# Patient Record
Sex: Male | Born: 1990 | Hispanic: No | Marital: Single | Smoking: Current every day smoker
Health system: Southern US, Community
[De-identification: ages and names within clinical notes are randomized; demographics above are authoritative.]

---
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
## 2012-12-28 ENCOUNTER — Encounter (HOSPITAL_COMMUNITY): Payer: Self-pay | Admitting: *Deleted

## 2012-12-28 ENCOUNTER — Emergency Department (INDEPENDENT_AMBULATORY_CARE_PROVIDER_SITE_OTHER)
Admission: EM | Admit: 2012-12-28 | Discharge: 2012-12-28 | Disposition: A | Discharge status: Home / Self Care | Payer: Self-pay | Source: Home / Self Care

## 2012-12-28 DIAGNOSIS — K0889 Other specified disorders of teeth and supporting structures: Secondary | ICD-10-CM

## 2012-12-28 DIAGNOSIS — K089 Disorder of teeth and supporting structures, unspecified: Secondary | ICD-10-CM

## 2012-12-28 MED ORDER — AMOXICILLIN 500 MG PO CAPS: 500.0000 mg | ORAL_CAPSULE | Freq: Three times a day (TID) | ORAL | Status: DC

## 2012-12-28 MED ORDER — HYDROCODONE-ACETAMINOPHEN 5-325 MG PO TABS: 2.0000 | ORAL_TABLET | ORAL | Status: DC | PRN

## 2012-12-28 NOTE — ED Provider Notes (Signed)
  CSN: [id number]     Arrival date & time 12/28/12  1227 History     First MD Initiated Contact with Patient 12/28/12 1353     Chief Complaint  Patient presents with  . Dental Pain   (Consider location/radiation/quality/duration/timing/severity/associated sxs/prior Treatment) Patient is a 22 y.o. male presenting with tooth pain. The history is provided by the patient. No language interpreter was used.  Dental Pain Location:  Lower Lower teeth location:  22/LL cuspid Quality:  Aching Severity:  Moderate Onset quality:  Gradual Timing:  Constant Progression:  Worsening Pt complains of a toothache  History reviewed. No pertinent past medical history. History reviewed. No pertinent past surgical history. History reviewed. No pertinent family history. History  Substance Use Topics  . Smoking status: Current Every Day Smoker  . Smokeless tobacco: Not on file  . Alcohol Use: Yes    Review of Systems  HENT: Positive for dental problem.   All other systems reviewed and are negative.    Allergies  Review of patient's allergies indicates no known allergies.  Home Medications  No current outpatient prescriptions on file. BP 135/71  Pulse 70  Temp(Src) 99.4 F (37.4 C) (Oral)  Resp 18  SpO2 100% Physical Exam  Nursing note and vitals reviewed. Constitutional: He is oriented to person, place, and time. He appears well-developed and well-nourished.  HENT:  Head: Normocephalic.  Swelling left face   Cardiovascular: Normal rate.   Pulmonary/Chest: Effort normal.  Musculoskeletal: Normal range of motion.  Neurological: He is alert and oriented to person, place, and time. He has normal reflexes.  Skin: Skin is warm.  Psychiatric: He has a normal mood and affect.    ED Course   Procedures (including critical care time)  Labs Reviewed - No data to display No results found. 1. Toothache     MDM  amoxicillian and hydrocodone,    Elson Areas, PA-C 12/28/12  1415  Medical screening examination/treatment/procedure(s) were performed by a resident physician or non-physician practitioner and as the supervising physician I was immediately available for consultation/collaboration.  Clementeen Graham, MD   Rodolph Bong, MD 12/28/12 Rosamaria Lints

## 2012-12-28 NOTE — ED Notes (Signed)
Pt  Has  Pain  /  Swelling  To  The  Left  Side  Of  His   Face      For  Several  Days         He reports  He       Has  Had  A  Broken tooth  In  Past      He  Is  Sitting  Upright on  Exam table  Speaking in  Complete  sentances  And  Is  In no  Severe  Distress

## 2014-01-31 ENCOUNTER — Encounter (HOSPITAL_COMMUNITY): Payer: Self-pay | Admitting: Emergency Medicine

## 2014-01-31 ENCOUNTER — Emergency Department (HOSPITAL_COMMUNITY)
Admission: EM | Admit: 2014-01-31 | Discharge: 2014-01-31 | Disposition: A | Discharge status: Home / Self Care | Payer: Self-pay | Attending: Emergency Medicine | Admitting: Emergency Medicine

## 2014-01-31 DIAGNOSIS — H9209 Otalgia, unspecified ear: Secondary | ICD-10-CM | POA: Insufficient documentation

## 2014-01-31 DIAGNOSIS — F17200 Nicotine dependence, unspecified, uncomplicated: Secondary | ICD-10-CM | POA: Insufficient documentation

## 2014-01-31 DIAGNOSIS — K089 Disorder of teeth and supporting structures, unspecified: Secondary | ICD-10-CM | POA: Insufficient documentation

## 2014-01-31 DIAGNOSIS — K0889 Other specified disorders of teeth and supporting structures: Secondary | ICD-10-CM

## 2014-01-31 DIAGNOSIS — Z792 Long term (current) use of antibiotics: Secondary | ICD-10-CM | POA: Insufficient documentation

## 2014-01-31 DIAGNOSIS — R51 Headache: Secondary | ICD-10-CM | POA: Insufficient documentation

## 2014-01-31 MED ORDER — HYDROCODONE-ACETAMINOPHEN 5-325 MG PO TABS: 1.0000 | ORAL_TABLET | Freq: Four times a day (QID) | ORAL | Status: DC | PRN

## 2014-01-31 MED ORDER — PENICILLIN V POTASSIUM 500 MG PO TABS: 500.0000 mg | ORAL_TABLET | Freq: Four times a day (QID) | ORAL | Status: AC
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
## 2014-01-31 NOTE — ED Notes (Signed)
Pt in c/o toothache for awhile, states he has an abscess to area, denies fever

## 2014-01-31 NOTE — Discharge Instructions (Signed)

## 2014-01-31 NOTE — ED Provider Notes (Signed)
CSN: [id number]     Arrival date & time 01/31/14  1914 History  This chart was scribed for non-physician practitioner, Roxy Horseman, PA-C working with Flint Melter, MD, by Jarvis Morgan, ED Scribe. This patient was seen in room TR06C/TR06C and the patient's care was started at 7:47 PM.   Chief Complaint  Patient presents with  . Dental Pain      The history is provided by the patient. No language interpreter was used.   HPI Comments: Victor Weiss is a 23 y.o. male with no pertinent medical history who presents to the Emergency Department with a chief complaint of dental pain. He states he had an abscess to his tooth on the left side of his mouth. He notes that the abscess popped while he was in the waiting room. He was given gauze by the triage nurse to control the bleeding. He states he is having associated left otalgia and HA. Pt has not taken anything for the pain. He denies any fever, nausea, or vomiting,   History reviewed. No pertinent past medical history. History reviewed. No pertinent past surgical history. History reviewed. No pertinent family history. History  Substance Use Topics  . Smoking status: Current Every Day Smoker  . Smokeless tobacco: Not on file  . Alcohol Use: Yes    Review of Systems  Constitutional: Negative for fever.  HENT: Positive for dental problem and ear pain.   Gastrointestinal: Negative for nausea and vomiting.  Neurological: Positive for headaches.      Allergies  Review of patient's allergies indicates no known allergies.  Home Medications   Prior to Admission medications   Medication Sig Start Date End Date Taking? Authorizing Provider  amoxicillin (AMOXIL) 500 MG capsule Take 1 capsule (500 mg total) by mouth 3 (three) times daily. 12/28/12   Elson Areas, PA-C  HYDROcodone-acetaminophen (NORCO/VICODIN) 5-325 MG per tablet Take 2 tablets by mouth every 4 (four) hours as needed for pain. 12/28/12   Elson Areas, PA-C    Triage Vitals: BP 120/54  Pulse 91  Temp(Src) 98.2 F (36.8 C)  Resp 20  Wt 131 lb 9 oz (59.676 kg)  SpO2 100%  Physical Exam  Nursing note and vitals reviewed. Constitutional: He is oriented to person, place, and time. He appears well-developed and well-nourished. No distress.  HENT:  Head: Normocephalic and atraumatic.  Mouth/Throat:    Poor dentition throughout.  Affected tooth as diagrammed.  No signs of peritonsillar or tonsillar abscess.  No signs of gingival abscess. Oropharynx is clear and without exudates.  Uvula is midline.  Airway is intact. No signs of Ludwig's angina with palpation of oral and sublingual mucosa.   Eyes: Conjunctivae and EOM are normal.  Neck: Neck supple. No tracheal deviation present.  Cardiovascular: Normal rate.   Pulmonary/Chest: Effort normal. No respiratory distress.  Musculoskeletal: Normal range of motion.  Neurological: He is alert and oriented to person, place, and time.  Skin: Skin is warm and dry.  Psychiatric: He has a normal mood and affect. His behavior is normal.    ED Course  Procedures (including critical care time)  DIAGNOSTIC STUDIES: Oxygen Saturation is 100% on RA, normal by my interpretation.    COORDINATION OF CARE:    Labs Review Labs Reviewed - No data to display  Imaging Review No results found.   EKG Interpretation None      MDM   Final diagnoses:  Pain, dental   Patient with toothache.  No gross abscess.  Exam unconcerning for Ludwig's angina or spread of infection.  Will treat with penicillin and pain medicine.  Urged patient to follow-up with dentist.    I personally performed the services described in this documentation, which was scribed in my presence. The recorded information has been reviewed and is accurate.    Roxy Horseman, PA-C 01/31/14 2007

## 2014-02-01 NOTE — ED Provider Notes (Signed)
Medical screening examination/treatment/procedure(s) were performed by non-physician practitioner and as supervising physician I was immediately available for consultation/collaboration.   EKG Interpretation None       Sasuke Yaffe L Rilyn Upshaw, MD 02/01/14 1156 

## 2014-02-09 ENCOUNTER — Emergency Department (HOSPITAL_COMMUNITY)
Admission: EM | Admit: 2014-02-09 | Discharge: 2014-02-10 | Disposition: A | Discharge status: Home / Self Care | Payer: Self-pay | Attending: Emergency Medicine | Admitting: Emergency Medicine

## 2014-02-09 ENCOUNTER — Encounter (HOSPITAL_COMMUNITY): Payer: Self-pay | Admitting: Emergency Medicine

## 2014-02-09 ENCOUNTER — Emergency Department (HOSPITAL_COMMUNITY)
Admission: EM | Admit: 2014-02-09 | Discharge: 2014-02-09 | Discharge status: Against Medical Advice | Payer: Self-pay | Attending: Emergency Medicine | Admitting: Emergency Medicine

## 2014-02-09 DIAGNOSIS — Z792 Long term (current) use of antibiotics: Secondary | ICD-10-CM | POA: Insufficient documentation

## 2014-02-09 DIAGNOSIS — Z720 Tobacco use: Secondary | ICD-10-CM | POA: Insufficient documentation

## 2014-02-09 DIAGNOSIS — S0993XA Unspecified injury of face, initial encounter: Secondary | ICD-10-CM | POA: Insufficient documentation

## 2014-02-09 DIAGNOSIS — S023XXA Fracture of orbital floor, initial encounter for closed fracture: Secondary | ICD-10-CM | POA: Insufficient documentation

## 2014-02-09 DIAGNOSIS — S0232XA Fracture of orbital floor, left side, initial encounter for closed fracture: Secondary | ICD-10-CM

## 2014-02-09 MED ORDER — OXYCODONE-ACETAMINOPHEN 5-325 MG PO TABS
1.0000 | ORAL_TABLET | Freq: Once | ORAL | Status: AC
  Administered 2014-02-09: 1 via ORAL
  Filled 2014-02-09: qty 1

## 2014-02-09 MED ORDER — ONDANSETRON 4 MG PO TBDP
4.0000 mg | ORAL_TABLET | Freq: Once | ORAL | Status: AC
  Administered 2014-02-10: 4 mg via ORAL
  Filled 2014-02-09: qty 1

## 2014-02-09 MED ORDER — OXYCODONE-ACETAMINOPHEN 5-325 MG PO TABS
2.0000 | ORAL_TABLET | Freq: Once | ORAL | Status: AC
  Administered 2014-02-10: 2 via ORAL
  Filled 2014-02-09: qty 2
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
## 2014-02-10 ENCOUNTER — Emergency Department (HOSPITAL_COMMUNITY): Payer: Self-pay

## 2014-02-10 MED ORDER — OXYCODONE-ACETAMINOPHEN 5-325 MG PO TABS
2.0000 | ORAL_TABLET | Freq: Once | ORAL | Status: AC
  Administered 2014-02-10: 2 via ORAL
  Filled 2014-02-10: qty 2

## 2014-02-10 MED ORDER — AMOXICILLIN 500 MG PO CAPS: 500.0000 mg | ORAL_CAPSULE | Freq: Three times a day (TID) | ORAL | Status: DC

## 2014-02-10 MED ORDER — OXYCODONE-ACETAMINOPHEN 5-325 MG PO TABS: 2.0000 | ORAL_TABLET | ORAL | Status: DC | PRN

## 2014-02-10 MED ORDER — PROMETHAZINE HCL 25 MG PO TABS: 25.0000 mg | ORAL_TABLET | Freq: Four times a day (QID) | ORAL | Status: DC | PRN

## 2014-02-10 NOTE — Discharge Instructions (Signed)
Take Amoxicillin as directed until gone. Take Percocet as needed for pain. Take Phenergan as needed for nausea.   Call Dr. Eliane DecreePatel's office in the morning to schedule an appointment. Call Dr. Lazarus SalinesWolicki to schedule an appointment. Refer to attached documents for more information.

## 2014-02-12 NOTE — ED Provider Notes (Signed)
Medical screening examination/treatment/procedure(s) were performed by non-physician practitioner and as supervising physician I was immediately available for consultation/collaboration.   EKG Interpretation None        Mirian MoMatthew Gentry, MD 02/12/14 [phone]

## 2014-05-16 ENCOUNTER — Encounter (HOSPITAL_COMMUNITY): Payer: Self-pay | Admitting: Emergency Medicine

## 2014-05-16 ENCOUNTER — Emergency Department (HOSPITAL_COMMUNITY)
Admission: EM | Admit: 2014-05-16 | Discharge: 2014-05-16 | Disposition: A | Discharge status: Home / Self Care | Payer: Self-pay | Attending: Emergency Medicine | Admitting: Emergency Medicine

## 2014-05-16 DIAGNOSIS — K0381 Cracked tooth: Secondary | ICD-10-CM | POA: Insufficient documentation

## 2014-05-16 DIAGNOSIS — Z79899 Other long term (current) drug therapy: Secondary | ICD-10-CM | POA: Insufficient documentation

## 2014-05-16 DIAGNOSIS — Z792 Long term (current) use of antibiotics: Secondary | ICD-10-CM | POA: Insufficient documentation

## 2014-05-16 DIAGNOSIS — R509 Fever, unspecified: Secondary | ICD-10-CM | POA: Insufficient documentation

## 2014-05-16 DIAGNOSIS — Z79891 Long term (current) use of opiate analgesic: Secondary | ICD-10-CM | POA: Insufficient documentation

## 2014-05-16 DIAGNOSIS — K0889 Other specified disorders of teeth and supporting structures: Secondary | ICD-10-CM

## 2014-05-16 DIAGNOSIS — K029 Dental caries, unspecified: Secondary | ICD-10-CM | POA: Insufficient documentation

## 2014-05-16 DIAGNOSIS — K002 Abnormalities of size and form of teeth: Secondary | ICD-10-CM | POA: Insufficient documentation

## 2014-05-16 DIAGNOSIS — Z720 Tobacco use: Secondary | ICD-10-CM | POA: Insufficient documentation

## 2014-05-16 DIAGNOSIS — K088 Other specified disorders of teeth and supporting structures: Secondary | ICD-10-CM | POA: Insufficient documentation

## 2014-05-16 MED ORDER — HYDROCODONE-ACETAMINOPHEN 5-325 MG PO TABS
2.0000 | ORAL_TABLET | Freq: Once | ORAL | Status: AC
  Administered 2014-05-16: 2 via ORAL
  Filled 2014-05-16: qty 2

## 2014-05-16 MED ORDER — PENICILLIN V POTASSIUM 500 MG PO TABS
500.0000 mg | ORAL_TABLET | Freq: Four times a day (QID) | ORAL | Status: AC
Start: 2014-05-16 — End: 2014-05-23

## 2014-05-16 MED ORDER — HYDROCODONE-ACETAMINOPHEN 5-325 MG PO TABS: 1.0000 | ORAL_TABLET | ORAL | Status: DC | PRN

## 2014-05-16 NOTE — Discharge Instructions (Signed)
Take the prescribed medication as directed. °Follow-up with dentist-- see referrals and resource guide. °Return to the ED for new or worsening symptoms. ° ° °Emergency Department Resource Guide °1) Find a Doctor and Pay Out of Pocket °Although you won't have to find out who is covered by your insurance plan, it is a good idea to ask around and get recommendations. You will then need to call the office and see if the doctor you have chosen will accept you as a new patient and what types of options they offer for patients who are self-pay. Some doctors offer discounts or will set up payment plans for their patients who do not have insurance, but you will need to ask so you aren't surprised when you get to your appointment. ° °2) Contact Your Local Health Department °Not all health departments have doctors that can see patients for sick visits, but many do, so it is worth a call to see if yours does. If you don't know where your local health department is, you can check in your phone book. The CDC also has a tool to help you locate your state's health department, and many state websites also have listings of all of their local health departments. ° °3) Find a Walk-in Clinic °If your illness is not likely to be very severe or complicated, you may want to try a walk in clinic. These are popping up all over the country in pharmacies, drugstores, and shopping centers. They're usually staffed by nurse practitioners or physician assistants that have been trained to treat common illnesses and complaints. They're usually fairly quick and inexpensive. However, if you have serious medical issues or chronic medical problems, these are probably not your best option. ° °No Primary Care Doctor: °- Call [facility] at  832-8000 - they can help you locate a primary care doctor that  accepts your insurance, provides certain services, etc. °- Physician Referral Service- 1-800-533-3463 ° °Chronic Pain Problems: °Organization          Address  Phone   Notes  °[facility]  (336) 297-2271 Patients need to be referred by their primary care doctor.  ° °Medication Assistance: °Organization         Address  Phone   Notes  °[place] [facility] [address] °[place], [place] [address] (336) 641-8030 --Must be a resident of [place] °-- Must have NO insurance coverage whatsoever (no Medicaid/ Medicare, etc.) °-- The pt. MUST have a primary care doctor that directs their care regularly and follows them in the community °  °[facility]  (866) 331-1348   °[employer]  (888) 892-1162   ° °Agencies that provide inexpensive medical care: °Organization         Address  Phone   Notes  °[facility]  (336) 832-8035   °[facility]    (336) 832-7272   °[facility] [address] °[place], [place] [address] (336) 832-4777   °[facility] [address], °[place] (336) 271-4999   °[facility]    (336) 373-0678   °[facility]    (336) 272-1050   °[facility] ° [address], [place] Phone:  (336) 832-4444, Fax:  (336) 832-4440 Hours of Operation:  9 am - 6 pm, M-F.  Also accepts Medicaid/Medicare and self-pay.  °[facility] ° [address], [place] Phone: (336)   832-3150, Fax: (336) 832-3151. Hours of Operation:  8:30 am - 5:30 pm, M-F.  Also accepts Medicaid and self-pay.  °[facility] [place] [address], [place] Phone: (336) 878-6027   °[facility] [address], [place], [place] (336)723-1848, Ext. 123 Mondays & Thursdays: 7-9 AM.  First 15 patients are seen on a first come, first serve basis. °  ° °Medicaid-accepting [place] Providers: ° °Organization         Address  Phone   Notes  °[facility] [address], [place] (336) 641-2100 Also accepts self-pay patients.  °[facility] [address], [place] ° (336) 856-9996   °[facility] [address], [place] (336) 288-8857   °[facility] [address] [place] [address], [place] (336) 299-7000   °Veita Bland [address], [place]  ° (336) 373-1557 Only accepts [place] Access Medicaid patients after they have their name applied to their card.  ° °Self-Pay (no insurance) in [place]: ° °Organization         Address  Phone   Notes  °Sickle Cell Patients, [facility] [address], [place] (336) 832-1970   ° [facility] [address], [place] (336) 832-4400   ° [facility] ° [address], [place] (336) 992-4800   °[facility]/Dr. Osei-Bonsu ° [address] [place] [address], [place] or [address], [place] (336) 841-8500 Phone number for both [place] and [place] locations is the same.  °[facility] [address], [place] (336) 299-0000   °[facility] [address] [place] [address], [place] or [address] (336) 852-7530 °(336) 878-2260   °[facility] [address], [place] (336) 350-1642, phone; (336) 294-5005, fax Sees patients 1st and 3rd Saturday of every month.  Must not qualify for public or private insurance (i.e. Medicaid, Medicare, [place] Health Choice, Veterans' Benefits) • Household income should be no more than 200% of the poverty level •The clinic cannot treat you if you are pregnant or think you are pregnant • Sexually transmitted diseases are not treated at the clinic.  ° ° °Dental Care: °Organization         Address  Phone  Notes  °[facility] [address],  (336) 641-6152 Accepts children up to age 21 who are enrolled in Medicaid or [place] Health Choice; pregnant women with a Medicaid card; and  children who have applied for Medicaid or [place] Health Choice, but were declined, whose parents can pay a reduced fee at time of service.  °[facility] [place]  [address], [place] (336) 641-7733 Accepts children up to age 21 who are enrolled in Medicaid or [place] Health Choice; pregnant women with a Medicaid card; and children who have applied for Medicaid or [place] Health Choice, but were declined, whose parents can pay a reduced fee at time of service.  °[facility] ° [address],  (336) 641-4533 Patients are seen by appointment only. Walk-ins are not accepted. [facility] will see patients 18 years of age and older. °Monday - Tuesday (8am-5pm) °Most Wednesdays (8:30-5pm) °$30 per visit, cash only  °[facility] ° [address], [place] (336) 641-4533 Patients are seen by appointment only. Walk-ins are not accepted. [facility] will see patients 18 years of age and older. °One Wednesday Evening (Monthly: Volunteer Based).  $30 per visit, cash only  °[facility]  (919) 537-3737 for adults; Children under age 4, call Graduate Pediatric Dentistry at (919) [phone]. Children aged 4-14, please call (919) [phone] to request a pediatric application. ° Dental services are provided in all areas of dental care including fillings, crowns and bridges, complete and partial dentures, implants, gum treatment, root canals, and extractions. Preventive care is also provided. Treatment is provided to both adults and children. °Patients are selected via a lottery and there is often a waiting list. °  °[facility] [address], °[place] ° (336) 763-8833 [email or web address] °  °[facility] [address], [place], [place] (336)723-1848, Ext. 123 Second and Fourth Thursday of each month, opens at 6:30 AM; Clinic ends at 9 AM.  Patients are seen on a first-come first-served  basis, and a limited number are seen during each clinic.  ° °[facility] ° [address], [place], [place] (336) 723-7904   Eligibility Requirements °You must have lived in [place], [place], or [place] counties for at least the last three months. °  You cannot be eligible for state or federal sponsored healthcare insurance, including Veterans Administration, Medicaid, or Medicare. °  You generally cannot be eligible for healthcare insurance through your employer.  °  How to apply: °Eligibility screenings are held every Tuesday and Wednesday afternoon from 1:00 pm until 4:00 pm. You do not need an appointment for the interview!  °[facility] [address], [place], [place] 336-631-2330   °[facility]  336-342-8273   °[facility]  336-703-3100   °[facility]  336-570-6415   ° °Behavioral Health Resources in the Community: °Intensive Outpatient Programs °Organization         Address  Phone  Notes  °[facility] [address], [place], [place] 336-878-6098   °[facility] [address], [place], [place] 336-832-9800   °[facility] [address], [place], [place] ° 336-882-2125   °[facility] [address],  °[place], [place] 1-800-853-5163 or 336-641-4981   °Substance Abuse Resources °Organization         Address  Phone  Notes  °[facility]  336-882-2125   °[facility]  336-784-9470   °[facility]  336-285-9073   °[facility]  336-845-3988   °Residential & Outpatient Substance Abuse Program  1-800-659-3381   °Psychological Services °Organization         Address  Phone  Notes  °[facility]  336- 832-9600   °[facility]  336- 378-7881   °[facility] [address], [place] 1-800-853-5163 or 336-641-4981   ° °Mobile Crisis Teams °Organization          Address  Phone  Notes  °[facility], Mobile Crisis Care Unit  1-877-626-1772   °Assertive °[facility] ° [address] [place], [place] 336-834-9664   °Sharon DeEsch [address] °[place] 336-554-5454   ° °Self-Help/Support Groups °Organization         Address  Phone             Notes  °[facility] - variety of support groups    336- 373-1402 Call for more information  °Narcotics Anonymous (NA), [facility] [address], °[place]  2 meetings at this location  ° °Residential Treatment Programs °Organization         Address  Phone  Notes  °[facility] [address],    °[place]  1-866-801-8205   °[facility] ° [address], [place], [place] 704-293-8524   °[facility] [address], [place] 336-845-3988 Admissions: 8am-3pm M-F  °[facility] [address]    °[place], [place] 336-841-1104   °[facility] [address], [place], [place] 336-379-7146   °[facility] [address]  °[place], [place] 336-285-9073   °[facility] - Intensive Outpatient [address], [place], [place] 336-852-3033   °[facility] [address]  °[place], [place] 1-877-615-2722 or 336-784-9470   °[facility] [address] , [place] 336-227-7417 Accepts Medicaid  °[facility] [address]  °[place] 1-800-659-3381 Substance Abuse/Addiction Treatment  ° °[place] Behavioral Health Resources °Organization         Address  Phone  Notes  °[facility]  (888) 581-9988   °Julie Brannon, PhD [address] [place], [place]   (336) 349-5553 or (336) 951-0000   °[facility]   [address] °[place], [place] (336) 349-4454   °[facility] [address], [place], [place] (336) 342-8316 Insurance/Medicaid/sponsorship  through [facility]  °[facility] [address]                                    [place], [place] (336) 342-8316 Therapy/tele-psych/case  °[facility] [address]  ° [place], [place] (336) 349-2233    °Dr. Arfeen  (336) 349-4544   °[facility] [place]  [employer] [place] [facility] 1) [address], [place] °2) [address], [place] °3)  [address], [place] (336) 349-3220 °(336) 342-7768 ° °(336) 342-8140   °[place] [facility] (336) 342-1394 or (336) 342-3537 (After Hours)    ° ° ° °

## 2014-05-16 NOTE — ED Provider Notes (Signed)
CSN: [id number]     Arrival date & time 05/16/14  0745 History   First MD Initiated Contact with Patient 05/16/14 [phone]     Chief Complaint  Patient presents with  . Dental Pain     (Consider location/radiation/quality/duration/timing/severity/associated sxs/prior Treatment) Patient is a 24 y.o. male presenting with tooth pain. The history is provided by the patient and medical records.  Dental Pain   This is a 24 year old male with no significant past medical history presenting to the ED for left lower dental pain for the past 2 days. He endorses subjective fever and chills.  Affected tooth has been broken for the past several months, giving him problems intermittently but never this severe.  He states the left side of his face feels somewhat swollen.  No neck swelling, no difficulty swallowing.  Patient states now pain is radiating throughout the left side of his face, worse along left lower jaw.  He has been taking motrin without relief.  Patient not currently established with dentist.  No past medical history on file. No past surgical history on file. No family history on file. History  Substance Use Topics  . Smoking status: Current Every Day Smoker  . Smokeless tobacco: Not on file  . Alcohol Use: Yes    Review of Systems  HENT: Positive for dental problem.   All other systems reviewed and are negative.     Allergies  Review of patient's allergies indicates no known allergies.  Home Medications   Prior to Admission medications   Medication Sig Start Date End Date Taking? Authorizing Provider  amoxicillin (AMOXIL) 500 MG capsule Take 1 capsule (500 mg total) by mouth 3 (three) times daily. 02/10/14   Emilia Beck, PA-C  HYDROcodone-acetaminophen (NORCO/VICODIN) 5-325 MG per tablet Take 1 tablet by mouth every 6 (six) hours as needed. 01/31/14   Roxy Horseman, PA-C  oxyCODONE-acetaminophen (PERCOCET/ROXICET) 5-325 MG per tablet Take 2 tablets by mouth every 4 (four)  hours as needed for moderate pain or severe pain. 02/10/14   Kaitlyn Szekalski, PA-C  penicillin v potassium (VEETID) 500 MG tablet Take 500 mg by mouth 4 (four) times daily.    Historical Provider, MD  promethazine (PHENERGAN) 25 MG tablet Take 1 tablet (25 mg total) by mouth every 6 (six) hours as needed for nausea or vomiting. 02/10/14   Kaitlyn Szekalski, PA-C   BP 120/80 mmHg  Pulse 65  Temp(Src) 98.6 F (37 C) (Oral)  Resp 18  Ht  (1.676 m)  Wt 140 lb (63.504 kg)  BMI 22.61 kg/m2  SpO2 100%   Physical Exam  Constitutional: He is oriented to person, place, and time. He appears well-developed and well-nourished.  HENT:  Head: Normocephalic and atraumatic.  Mouth/Throat: Uvula is midline, oropharynx is clear and moist and mucous membranes are normal. Abnormal dentition. Dental caries present. No oropharyngeal exudate, posterior oropharyngeal edema, posterior oropharyngeal erythema or tonsillar abscesses.    Teeth largely in fair dentition, left lower molar broken with cavity present, surrounding gingiva swollen and erythematous concerning for dental abscess, handling secretions appropriately, no trismus  Eyes: Conjunctivae and EOM are normal. Pupils are equal, round, and reactive to light.  Neck: Normal range of motion. Neck supple.  Cardiovascular: Normal rate, regular rhythm and normal heart sounds.   Pulmonary/Chest: Effort normal and breath sounds normal. No respiratory distress. He has no wheezes.  Musculoskeletal: Normal range of motion.  Neurological: He is alert and oriented to person, place, and time.  Skin: Skin is warm  and dry.  Psychiatric: He has a normal mood and affect.  Nursing note and vitals reviewed.   ED Course  Procedures (including critical care time) Labs Review Labs Reviewed - No data to display  Imaging Review No results found.   EKG Interpretation None      MDM   Final diagnoses:  Pain, dental   Dental pain with possibly developing  dental abscess.  Will start on abx and pain meds.  Encouraged FU with dentist, referrals and resource guide provided.  Discussed plan with patient, he/she acknowledged understanding and agreed with plan of care.  Return precautions given for new or worsening symptoms.  Garlon HatchetLisa M Jennica Tagliaferri, PA-C 05/16/14 [phone]  Mirian MoMatthew Gentry, MD 05/16/14 [phone]

## 2014-05-16 NOTE — ED Notes (Signed)
Pt reports lower L sided tooth pain for past 2 days with fever, reports radiating pain to temple with swelling in jaw.  Breathing is even and unlabored.

## 2018-04-25 ENCOUNTER — Emergency Department (HOSPITAL_COMMUNITY): Payer: No Typology Code available for payment source

## 2018-04-25 ENCOUNTER — Other Ambulatory Visit: Payer: Self-pay

## 2018-04-25 ENCOUNTER — Emergency Department (HOSPITAL_COMMUNITY)
Admission: EM | Admit: 2018-04-25 | Discharge: 2018-04-25 | Disposition: A | Discharge status: Home / Self Care | Payer: No Typology Code available for payment source | Attending: Emergency Medicine | Admitting: Emergency Medicine

## 2018-04-25 ENCOUNTER — Encounter (HOSPITAL_COMMUNITY): Payer: Self-pay | Admitting: Emergency Medicine

## 2018-04-25 DIAGNOSIS — Y939 Activity, unspecified: Secondary | ICD-10-CM | POA: Insufficient documentation

## 2018-04-25 DIAGNOSIS — S39012A Strain of muscle, fascia and tendon of lower back, initial encounter: Secondary | ICD-10-CM | POA: Diagnosis not present

## 2018-04-25 DIAGNOSIS — M7918 Myalgia, other site: Secondary | ICD-10-CM

## 2018-04-25 DIAGNOSIS — M545 Low back pain: Secondary | ICD-10-CM | POA: Diagnosis present

## 2018-04-25 DIAGNOSIS — Y999 Unspecified external cause status: Secondary | ICD-10-CM | POA: Insufficient documentation

## 2018-04-25 DIAGNOSIS — R51 Headache: Secondary | ICD-10-CM | POA: Diagnosis not present

## 2018-04-25 DIAGNOSIS — R0789 Other chest pain: Secondary | ICD-10-CM | POA: Insufficient documentation

## 2018-04-25 DIAGNOSIS — Y929 Unspecified place or not applicable: Secondary | ICD-10-CM | POA: Insufficient documentation

## 2018-04-25 DIAGNOSIS — M546 Pain in thoracic spine: Secondary | ICD-10-CM | POA: Diagnosis not present

## 2018-04-25 DIAGNOSIS — F17210 Nicotine dependence, cigarettes, uncomplicated: Secondary | ICD-10-CM | POA: Insufficient documentation

## 2018-04-25 DIAGNOSIS — M542 Cervicalgia: Secondary | ICD-10-CM | POA: Diagnosis not present

## 2018-04-25 LAB — CBC
HEMATOCRIT: 41.8 % (ref 39.0–52.0)
Hemoglobin: 14.6 g/dL (ref 13.0–17.0)
MCH: 29.7 pg (ref 26.0–34.0)
MCHC: 34.9 g/dL (ref 30.0–36.0)
MCV: 85.1 fL (ref 80.0–100.0)
Platelets: 300 10*3/uL (ref 150–400)
RBC: 4.91 MIL/uL (ref 4.22–5.81)
RDW: 12.5 % (ref 11.5–15.5)
WBC: 9 10*3/uL (ref 4.0–10.5)
nRBC: 0 % (ref 0.0–0.2)

## 2018-04-25 LAB — BASIC METABOLIC PANEL
Anion gap: 9 (ref 5–15)
BUN: 8 mg/dL (ref 6–20)
CALCIUM: 9.3 mg/dL (ref 8.9–10.3)
CO2: 21 mmol/L — AB (ref 22–32)
CREATININE: 1.1 mg/dL (ref 0.61–1.24)
Chloride: 111 mmol/L (ref 98–111)
GFR calc Af Amer: >60 mL/min (ref >60)
GFR calc non Af Amer: >60 mL/min (ref >60)
Glucose, Bld: 88 mg/dL (ref 70–99)
Potassium: 3.8 mmol/L (ref 3.5–5.1)
Sodium: 141 mmol/L (ref 135–145)

## 2018-04-25 MED ORDER — ONDANSETRON HCL 4 MG/2ML IJ SOLN
4.0000 mg | Freq: Once | INTRAMUSCULAR | Status: AC
  Administered 2018-04-25: 4 mg via INTRAVENOUS
  Filled 2018-04-25: qty 2

## 2018-04-25 MED ORDER — FENTANYL CITRATE (PF) 100 MCG/2ML IJ SOLN
25.0000 ug | Freq: Once | INTRAMUSCULAR | Status: AC
  Administered 2018-04-25: 25 ug via INTRAVENOUS
  Filled 2018-04-25: qty 2

## 2018-04-25 MED ORDER — NAPROXEN 500 MG PO TABS: 500.0000 mg | ORAL_TABLET | Freq: Two times a day (BID) | ORAL | 0 refills | Status: AC

## 2018-04-25 MED ORDER — HYDROCODONE-ACETAMINOPHEN 5-325 MG PO TABS: 1.0000 | ORAL_TABLET | Freq: Four times a day (QID) | ORAL | 0 refills | Status: AC | PRN

## 2018-04-25 MED ORDER — LORAZEPAM 2 MG/ML IJ SOLN
1.0000 mg | Freq: Once | INTRAMUSCULAR | Status: AC
  Administered 2018-04-25: 1 mg via INTRAVENOUS
  Filled 2018-04-25: qty 1

## 2018-04-25 MED ORDER — IOHEXOL 300 MG/ML  SOLN
100.0000 mL | Freq: Once | INTRAMUSCULAR | Status: AC | PRN
  Administered 2018-04-25: 100 mL via INTRAVENOUS

## 2018-04-25 MED ORDER — SODIUM CHLORIDE 0.9 % IV SOLN
INTRAVENOUS | Status: DC
  Administered 2018-04-25: 19:00:00 via INTRAVENOUS

## 2018-04-25 MED ORDER — SODIUM CHLORIDE 0.9 % IV SOLN: INTRAVENOUS | Status: DC
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
## 2018-04-25 NOTE — ED Notes (Signed)
Pt to ct 

## 2018-04-25 NOTE — ED Notes (Signed)
C-collar placed.

## 2018-04-25 NOTE — ED Notes (Signed)
Patient transported to CT 
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
## 2019-01-22 ENCOUNTER — Emergency Department (HOSPITAL_COMMUNITY): Payer: Self-pay | Admitting: Anesthesiology

## 2019-01-22 ENCOUNTER — Inpatient Hospital Stay (HOSPITAL_COMMUNITY)
Admission: EM | Admit: 2019-01-22 | Discharge: 2019-01-26 | DRG: 958 | Disposition: A | Discharge status: Home / Self Care | Payer: Self-pay | Attending: General Surgery | Admitting: General Surgery

## 2019-01-22 ENCOUNTER — Other Ambulatory Visit: Payer: Self-pay

## 2019-01-22 ENCOUNTER — Encounter (HOSPITAL_COMMUNITY): Payer: Self-pay | Admitting: *Deleted

## 2019-01-22 ENCOUNTER — Emergency Department (HOSPITAL_COMMUNITY): Payer: Self-pay

## 2019-01-22 ENCOUNTER — Encounter (HOSPITAL_COMMUNITY): Admission: EM | Disposition: A | Discharge status: Home / Self Care | Payer: Self-pay | Source: Home / Self Care

## 2019-01-22 DIAGNOSIS — Z20828 Contact with and (suspected) exposure to other viral communicable diseases: Secondary | ICD-10-CM | POA: Diagnosis present

## 2019-01-22 DIAGNOSIS — F1490 Cocaine use, unspecified, uncomplicated: Secondary | ICD-10-CM | POA: Diagnosis present

## 2019-01-22 DIAGNOSIS — Y939 Activity, unspecified: Secondary | ICD-10-CM

## 2019-01-22 DIAGNOSIS — S32028A Other fracture of second lumbar vertebra, initial encounter for closed fracture: Secondary | ICD-10-CM | POA: Diagnosis present

## 2019-01-22 DIAGNOSIS — W3400XA Accidental discharge from unspecified firearms or gun, initial encounter: Secondary | ICD-10-CM

## 2019-01-22 DIAGNOSIS — S36898A Other injury of other intra-abdominal organs, initial encounter: Secondary | ICD-10-CM | POA: Diagnosis present

## 2019-01-22 DIAGNOSIS — R402142 Coma scale, eyes open, spontaneous, at arrival to emergency department: Secondary | ICD-10-CM | POA: Diagnosis present

## 2019-01-22 DIAGNOSIS — Y92009 Unspecified place in unspecified non-institutional (private) residence as the place of occurrence of the external cause: Secondary | ICD-10-CM

## 2019-01-22 DIAGNOSIS — F17210 Nicotine dependence, cigarettes, uncomplicated: Secondary | ICD-10-CM | POA: Diagnosis present

## 2019-01-22 DIAGNOSIS — T1490XA Injury, unspecified, initial encounter: Secondary | ICD-10-CM

## 2019-01-22 DIAGNOSIS — S36498A Other injury of other part of small intestine, initial encounter: Principal | ICD-10-CM | POA: Diagnosis present

## 2019-01-22 DIAGNOSIS — R402252 Coma scale, best verbal response, oriented, at arrival to emergency department: Secondary | ICD-10-CM | POA: Diagnosis present

## 2019-01-22 DIAGNOSIS — R402362 Coma scale, best motor response, obeys commands, at arrival to emergency department: Secondary | ICD-10-CM | POA: Diagnosis present

## 2019-01-22 DIAGNOSIS — S37052A Moderate laceration of left kidney, initial encounter: Secondary | ICD-10-CM | POA: Diagnosis present

## 2019-01-22 DIAGNOSIS — Z23 Encounter for immunization: Secondary | ICD-10-CM

## 2019-01-22 DIAGNOSIS — R309 Painful micturition, unspecified: Secondary | ICD-10-CM | POA: Diagnosis present

## 2019-01-22 HISTORY — PX: LAPAROTOMY: SHX154

## 2019-01-22 LAB — CBC
HCT: 44 % (ref 39.0–52.0)
Hemoglobin: 16.1 g/dL (ref 13.0–17.0)
MCH: 31 pg (ref 26.0–34.0)
MCHC: 36.6 g/dL — ABNORMAL HIGH (ref 30.0–36.0)
MCV: 84.6 fL (ref 80.0–100.0)
Platelets: 303 10*3/uL (ref 150–400)
RBC: 5.2 MIL/uL (ref 4.22–5.81)
RDW: 12 % (ref 11.5–15.5)
WBC: 11 10*3/uL — ABNORMAL HIGH (ref 4.0–10.5)
nRBC: 0 % (ref 0.0–0.2)

## 2019-01-22 LAB — COMPREHENSIVE METABOLIC PANEL
ALT: 30 U/L (ref 0–44)
AST: 28 U/L (ref 15–41)
Albumin: 4.2 g/dL (ref 3.5–5.0)
Alkaline Phosphatase: 56 U/L (ref 38–126)
Anion gap: 12 (ref 5–15)
BUN: 12 mg/dL (ref 6–20)
CO2: 20 mmol/L — ABNORMAL LOW (ref 22–32)
Calcium: 8.9 mg/dL (ref 8.9–10.3)
Chloride: 106 mmol/L (ref 98–111)
Creatinine, Ser: 1.32 mg/dL — ABNORMAL HIGH (ref 0.61–1.24)
GFR calc Af Amer: >60 mL/min (ref >60)
GFR calc non Af Amer: >60 mL/min (ref >60)
Glucose, Bld: 137 mg/dL — ABNORMAL HIGH (ref 70–99)
Potassium: 3.3 mmol/L — ABNORMAL LOW (ref 3.5–5.1)
Sodium: 138 mmol/L (ref 135–145)
Total Bilirubin: 0.7 mg/dL (ref 0.3–1.2)
Total Protein: 7.1 g/dL (ref 6.5–8.1)

## 2019-01-22 LAB — I-STAT CHEM 8, ED
BUN: 11 mg/dL (ref 6–20)
Calcium, Ion: 1.03 mmol/L — ABNORMAL LOW (ref 1.15–1.40)
Chloride: 105 mmol/L (ref 98–111)
Creatinine, Ser: 1.2 mg/dL (ref 0.61–1.24)
Glucose, Bld: 132 mg/dL — ABNORMAL HIGH (ref 70–99)
HCT: 46 % (ref 39.0–52.0)
Hemoglobin: 15.6 g/dL (ref 13.0–17.0)
Potassium: 3.3 mmol/L — ABNORMAL LOW (ref 3.5–5.1)
Sodium: 140 mmol/L (ref 135–145)
TCO2: 20 mmol/L — ABNORMAL LOW (ref 22–32)

## 2019-01-22 LAB — PROTIME-INR
INR: 1 (ref 0.8–1.2)
Prothrombin Time: 13 seconds (ref 11.4–15.2)

## 2019-01-22 LAB — SAMPLE TO BLOOD BANK

## 2019-01-22 LAB — LACTIC ACID, PLASMA: Lactic Acid, Venous: 3.3 mmol/L (ref 0.5–1.9)

## 2019-01-22 LAB — ETHANOL: Alcohol, Ethyl (B): <10 mg/dL (ref <10)

## 2019-01-22 SURGERY — LAPAROTOMY, EXPLORATORY
Anesthesia: General

## 2019-01-22 MED ORDER — FENTANYL CITRATE (PF) 100 MCG/2ML IJ SOLN
INTRAMUSCULAR | Status: AC
  Filled 2019-01-22: qty 2

## 2019-01-22 MED ORDER — FENTANYL CITRATE (PF) 100 MCG/2ML IJ SOLN
50.0000 ug | Freq: Once | INTRAMUSCULAR | Status: AC
  Administered 2019-01-22: 50 ug via INTRAVENOUS

## 2019-01-22 MED ORDER — FENTANYL CITRATE (PF) 100 MCG/2ML IJ SOLN
INTRAMUSCULAR | Status: AC | PRN
  Administered 2019-01-22: 50 ug via INTRAVENOUS

## 2019-01-22 MED ORDER — FENTANYL CITRATE (PF) 250 MCG/5ML IJ SOLN
INTRAMUSCULAR | Status: AC
  Filled 2019-01-22: qty 5

## 2019-01-22 MED ORDER — FENTANYL CITRATE (PF) 100 MCG/2ML IJ SOLN
INTRAMUSCULAR | Status: AC
  Administered 2019-01-22: 50 ug via INTRAVENOUS
  Filled 2019-01-22: qty 2

## 2019-01-22 MED ORDER — IOHEXOL 300 MG/ML  SOLN
100.0000 mL | Freq: Once | INTRAMUSCULAR | Status: AC | PRN
  Administered 2019-01-22: 100 mL via INTRAVENOUS

## 2019-01-22 MED ORDER — PROPOFOL 10 MG/ML IV BOLUS
INTRAVENOUS | Status: AC
  Filled 2019-01-22: qty 20

## 2019-01-22 SURGICAL SUPPLY — 50 items
BNDG GAUZE ELAST 4 BULKY (GAUZE/BANDAGES/DRESSINGS) ×3 IMPLANT
CHLORAPREP W/TINT 26 (MISCELLANEOUS) ×3 IMPLANT
CLIP VESOCCLUDE LG 6/CT (CLIP) IMPLANT
CLIP VESOCCLUDE MED 6/CT (CLIP) IMPLANT
CLIP VESOCCLUDE SM WIDE 6/CT (CLIP) IMPLANT
COVER SURGICAL LIGHT HANDLE (MISCELLANEOUS) ×3 IMPLANT
COVER WAND RF STERILE (DRAPES) ×3 IMPLANT
DRAPE LAPAROSCOPIC ABDOMINAL (DRAPES) ×3 IMPLANT
DRAPE WARM FLUID 44X44 (DRAPES) ×3 IMPLANT
DRSG OPSITE POSTOP 4X10 (GAUZE/BANDAGES/DRESSINGS) IMPLANT
DRSG OPSITE POSTOP 4X8 (GAUZE/BANDAGES/DRESSINGS) IMPLANT
ELECT BLADE 6.5 EXT (BLADE) IMPLANT
ELECT CAUTERY BLADE 6.4 (BLADE) ×3 IMPLANT
ELECT REM PT RETURN 9FT ADLT (ELECTROSURGICAL) ×3
ELECTRODE REM PT RTRN 9FT ADLT (ELECTROSURGICAL) ×1 IMPLANT
GLOVE BIOGEL PI IND STRL 7.0 (GLOVE) ×1 IMPLANT
GLOVE BIOGEL PI INDICATOR 7.0 (GLOVE) ×2
GLOVE SURG SS PI 7.0 STRL IVOR (GLOVE) ×3 IMPLANT
GOWN STRL REUS W/ TWL LRG LVL3 (GOWN DISPOSABLE) ×2 IMPLANT
GOWN STRL REUS W/TWL LRG LVL3 (GOWN DISPOSABLE) ×4
HANDLE SUCTION POOLE (INSTRUMENTS) ×1 IMPLANT
KIT BASIN OR (CUSTOM PROCEDURE TRAY) ×3 IMPLANT
LIGASURE IMPACT 36 18CM CVD LR (INSTRUMENTS) IMPLANT
LOOP VESSEL MAXI BLUE (MISCELLANEOUS) IMPLANT
LOOP VESSEL MINI RED (MISCELLANEOUS) IMPLANT
NEEDLE 22X1 1/2 (OR ONLY) (NEEDLE) IMPLANT
PACK GENERAL/GYN (CUSTOM PROCEDURE TRAY) ×3 IMPLANT
PAD ABD 8X10 STRL (GAUZE/BANDAGES/DRESSINGS) ×3 IMPLANT
PENCIL SMOKE EVACUATOR (MISCELLANEOUS) ×3 IMPLANT
RELOAD PROXIMATE 75MM BLUE (ENDOMECHANICALS) ×6 IMPLANT
SPONGE LAP 18X18 RF (DISPOSABLE) ×15 IMPLANT
STAPLER GUN LINEAR PROX 60 (STAPLE) ×3 IMPLANT
STAPLER PROXIMATE 75MM BLUE (STAPLE) ×3 IMPLANT
STAPLER VISISTAT (STAPLE) ×3 IMPLANT
STAPLER VISISTAT 35W (STAPLE) ×3 IMPLANT
SUCTION POOLE HANDLE (INSTRUMENTS) ×3
SUT PDS AB 0 CT 36 (SUTURE) ×6 IMPLANT
SUT PDS AB 1 TP1 96 (SUTURE) IMPLANT
SUT SILK 2 0 (SUTURE) ×2
SUT SILK 2 0 SH CR/8 (SUTURE) ×6 IMPLANT
SUT SILK 2 0 TIES 10X30 (SUTURE) ×3 IMPLANT
SUT SILK 2-0 18XBRD TIE 12 (SUTURE) ×1 IMPLANT
SUT SILK 3 0 (SUTURE) ×2
SUT SILK 3 0 SH CR/8 (SUTURE) ×6 IMPLANT
SUT SILK 3 0 TIES 10X30 (SUTURE) ×3 IMPLANT
SUT SILK 3-0 18XBRD TIE 12 (SUTURE) ×1 IMPLANT
TAPE CLOTH SURG 6X10 WHT LF (GAUZE/BANDAGES/DRESSINGS) ×3 IMPLANT
TOWEL GREEN STERILE (TOWEL DISPOSABLE) ×3 IMPLANT
TRAY FOLEY MTR SLVR 16FR STAT (SET/KITS/TRAYS/PACK) ×3 IMPLANT
YANKAUER SUCT BULB TIP NO VENT (SUCTIONS) IMPLANT
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
## 2019-01-23 ENCOUNTER — Encounter (HOSPITAL_COMMUNITY): Payer: Self-pay | Admitting: General Surgery

## 2019-01-23 DIAGNOSIS — W3400XA Accidental discharge from unspecified firearms or gun, initial encounter: Secondary | ICD-10-CM

## 2019-01-23 LAB — CDS SEROLOGY

## 2019-01-23 LAB — SARS CORONAVIRUS 2 BY RT PCR (HOSPITAL ORDER, PERFORMED IN ~~LOC~~ HOSPITAL LAB): SARS Coronavirus 2: NEGATIVE

## 2019-01-23 LAB — CBC
HCT: 42.7 % (ref 39.0–52.0)
Hemoglobin: 14.8 g/dL (ref 13.0–17.0)
MCH: 29.9 pg (ref 26.0–34.0)
MCHC: 34.7 g/dL (ref 30.0–36.0)
MCV: 86.3 fL (ref 80.0–100.0)
Platelets: 261 10*3/uL (ref 150–400)
RBC: 4.95 MIL/uL (ref 4.22–5.81)
RDW: 12.2 % (ref 11.5–15.5)
WBC: 17.1 10*3/uL — ABNORMAL HIGH (ref 4.0–10.5)
nRBC: 0 % (ref 0.0–0.2)

## 2019-01-23 LAB — BASIC METABOLIC PANEL
Anion gap: 12 (ref 5–15)
BUN: 13 mg/dL (ref 6–20)
CO2: 20 mmol/L — ABNORMAL LOW (ref 22–32)
Calcium: 8.1 mg/dL — ABNORMAL LOW (ref 8.9–10.3)
Chloride: 105 mmol/L (ref 98–111)
Creatinine, Ser: 1.17 mg/dL (ref 0.61–1.24)
GFR calc Af Amer: >60 mL/min (ref >60)
GFR calc non Af Amer: >60 mL/min (ref >60)
Glucose, Bld: 130 mg/dL — ABNORMAL HIGH (ref 70–99)
Potassium: 4.1 mmol/L (ref 3.5–5.1)
Sodium: 137 mmol/L (ref 135–145)

## 2019-01-23 LAB — HIV ANTIBODY (ROUTINE TESTING W REFLEX): HIV Screen 4th Generation wRfx: NONREACTIVE

## 2019-01-23 MED ORDER — ROCURONIUM 10MG/ML (10ML) SYRINGE FOR MEDFUSION PUMP - OPTIME
INTRAVENOUS | Status: DC | PRN
  Administered 2019-01-23: 50 mg via INTRAVENOUS

## 2019-01-23 MED ORDER — FENTANYL CITRATE (PF) 100 MCG/2ML IJ SOLN
INTRAMUSCULAR | Status: AC
  Filled 2019-01-23: qty 2

## 2019-01-23 MED ORDER — MIDAZOLAM HCL 2 MG/2ML IJ SOLN
2.0000 mg | Freq: Once | INTRAMUSCULAR | Status: AC
  Administered 2019-01-23: 2 mg via INTRAVENOUS

## 2019-01-23 MED ORDER — MORPHINE SULFATE (PF) 2 MG/ML IV SOLN
2.0000 mg | INTRAVENOUS | Status: DC | PRN
  Administered 2019-01-23: 4 mg via INTRAVENOUS
  Administered 2019-01-23: 2 mg via INTRAVENOUS
  Administered 2019-01-23 (×2): 4 mg via INTRAVENOUS
  Filled 2019-01-23: qty 2
  Filled 2019-01-23: qty 1
  Filled 2019-01-23 (×2): qty 2

## 2019-01-23 MED ORDER — FENTANYL CITRATE (PF) 100 MCG/2ML IJ SOLN
25.0000 ug | INTRAMUSCULAR | Status: DC | PRN
  Administered 2019-01-23 (×2): 50 ug via INTRAVENOUS

## 2019-01-23 MED ORDER — SODIUM CHLORIDE 0.9% FLUSH: 9.0000 mL | INTRAVENOUS | Status: DC | PRN

## 2019-01-23 MED ORDER — SUGAMMADEX SODIUM 200 MG/2ML IV SOLN
INTRAVENOUS | Status: DC | PRN
  Administered 2019-01-23: 200 mg via INTRAVENOUS

## 2019-01-23 MED ORDER — SODIUM CHLORIDE 0.9 % IV SOLN
INTRAVENOUS | Status: DC
  Administered 2019-01-23 – 2019-01-24 (×4): via INTRAVENOUS

## 2019-01-23 MED ORDER — DIPHENHYDRAMINE HCL 50 MG/ML IJ SOLN: 12.5000 mg | Freq: Four times a day (QID) | INTRAMUSCULAR | Status: DC | PRN

## 2019-01-23 MED ORDER — DEXAMETHASONE SODIUM PHOSPHATE 10 MG/ML IJ SOLN
INTRAMUSCULAR | Status: DC | PRN
  Administered 2019-01-23: 10 mg via INTRAVENOUS

## 2019-01-23 MED ORDER — 0.9 % SODIUM CHLORIDE (POUR BTL) OPTIME
TOPICAL | Status: DC | PRN
  Administered 2019-01-23: 2000 mL

## 2019-01-23 MED ORDER — ONDANSETRON 4 MG PO TBDP: 4.0000 mg | ORAL_TABLET | Freq: Four times a day (QID) | ORAL | Status: DC | PRN

## 2019-01-23 MED ORDER — METHOCARBAMOL 1000 MG/10ML IJ SOLN
500.0000 mg | Freq: Three times a day (TID) | INTRAVENOUS | Status: DC
  Administered 2019-01-23 – 2019-01-24 (×3): 500 mg via INTRAVENOUS
  Filled 2019-01-23 (×6): qty 5

## 2019-01-23 MED ORDER — PHENYLEPHRINE HCL (PRESSORS) 10 MG/ML IV SOLN
INTRAVENOUS | Status: DC | PRN
  Administered 2019-01-23: 80 ug via INTRAVENOUS

## 2019-01-23 MED ORDER — ACETAMINOPHEN 325 MG PO TABS
650.0000 mg | ORAL_TABLET | Freq: Four times a day (QID) | ORAL | Status: DC
  Administered 2019-01-23 – 2019-01-26 (×14): 650 mg via ORAL
  Filled 2019-01-23 (×14): qty 2

## 2019-01-23 MED ORDER — SODIUM CHLORIDE (PF) 0.9 % IJ SOLN
INTRAMUSCULAR | Status: AC
  Filled 2019-01-23: qty 20

## 2019-01-23 MED ORDER — ENOXAPARIN SODIUM 40 MG/0.4ML ~~LOC~~ SOLN
40.0000 mg | SUBCUTANEOUS | Status: DC
  Administered 2019-01-23 – 2019-01-26 (×4): 40 mg via SUBCUTANEOUS
  Filled 2019-01-23 (×4): qty 0.4

## 2019-01-23 MED ORDER — DOCUSATE SODIUM 100 MG PO CAPS
100.0000 mg | ORAL_CAPSULE | Freq: Two times a day (BID) | ORAL | Status: DC
  Administered 2019-01-23 – 2019-01-26 (×7): 100 mg via ORAL
  Filled 2019-01-23 (×7): qty 1

## 2019-01-23 MED ORDER — SUCCINYLCHOLINE CHLORIDE 20 MG/ML IJ SOLN
INTRAMUSCULAR | Status: DC | PRN
  Administered 2019-01-23: 140 mg via INTRAVENOUS

## 2019-01-23 MED ORDER — PHENYLEPHRINE 40 MCG/ML (10ML) SYRINGE FOR IV PUSH (FOR BLOOD PRESSURE SUPPORT)
PREFILLED_SYRINGE | INTRAVENOUS | Status: AC
  Filled 2019-01-23: qty 10

## 2019-01-23 MED ORDER — FENTANYL CITRATE (PF) 250 MCG/5ML IJ SOLN
INTRAMUSCULAR | Status: DC | PRN
  Administered 2019-01-23: 100 ug via INTRAVENOUS
  Administered 2019-01-23: 50 ug via INTRAVENOUS
  Administered 2019-01-23: 100 ug via INTRAVENOUS

## 2019-01-23 MED ORDER — ONDANSETRON HCL 4 MG/2ML IJ SOLN
INTRAMUSCULAR | Status: AC
  Filled 2019-01-23: qty 2

## 2019-01-23 MED ORDER — ACETAMINOPHEN 325 MG PO TABS: 650.0000 mg | ORAL_TABLET | ORAL | Status: DC | PRN

## 2019-01-23 MED ORDER — FENTANYL CITRATE (PF) 100 MCG/2ML IJ SOLN
25.0000 ug | INTRAMUSCULAR | Status: DC | PRN
  Administered 2019-01-23 (×3): 50 ug via INTRAVENOUS

## 2019-01-23 MED ORDER — ONDANSETRON HCL 4 MG/2ML IJ SOLN
4.0000 mg | Freq: Once | INTRAMUSCULAR | Status: AC | PRN
  Administered 2019-01-23: 4 mg via INTRAVENOUS

## 2019-01-23 MED ORDER — MIDAZOLAM HCL 2 MG/2ML IJ SOLN
INTRAMUSCULAR | Status: AC
  Filled 2019-01-23: qty 2

## 2019-01-23 MED ORDER — LACTATED RINGERS IV SOLN
INTRAVENOUS | Status: DC | PRN
  Administered 2019-01-23: via INTRAVENOUS

## 2019-01-23 MED ORDER — OXYCODONE HCL 5 MG PO TABS: 5.0000 mg | ORAL_TABLET | ORAL | Status: DC | PRN

## 2019-01-23 MED ORDER — SODIUM CHLORIDE 0.9 % IV SOLN
INTRAVENOUS | Status: DC | PRN
  Administered 2019-01-22: via INTRAVENOUS

## 2019-01-23 MED ORDER — DIPHENHYDRAMINE HCL 12.5 MG/5ML PO ELIX: 12.5000 mg | ORAL_SOLUTION | Freq: Four times a day (QID) | ORAL | Status: DC | PRN

## 2019-01-23 MED ORDER — ACETAMINOPHEN 10 MG/ML IV SOLN
INTRAVENOUS | Status: AC
  Filled 2019-01-23: qty 100

## 2019-01-23 MED ORDER — METOCLOPRAMIDE HCL 5 MG/ML IJ SOLN
5.0000 mg | Freq: Three times a day (TID) | INTRAMUSCULAR | Status: DC | PRN
  Administered 2019-01-23: 5 mg via INTRAVENOUS
  Filled 2019-01-23: qty 2

## 2019-01-23 MED ORDER — ACETAMINOPHEN 10 MG/ML IV SOLN
1000.0000 mg | Freq: Once | INTRAVENOUS | Status: AC
  Administered 2019-01-23: 1000 mg via INTRAVENOUS

## 2019-01-23 MED ORDER — ONDANSETRON HCL 4 MG/2ML IJ SOLN
INTRAMUSCULAR | Status: DC | PRN
  Administered 2019-01-23: 4 mg via INTRAVENOUS

## 2019-01-23 MED ORDER — ONDANSETRON HCL 4 MG/2ML IJ SOLN
4.0000 mg | Freq: Four times a day (QID) | INTRAMUSCULAR | Status: DC | PRN
  Administered 2019-01-23: 4 mg via INTRAVENOUS
  Filled 2019-01-23: qty 2

## 2019-01-23 MED ORDER — CEFAZOLIN SODIUM-DEXTROSE 2-3 GM-%(50ML) IV SOLR
INTRAVENOUS | Status: DC | PRN
  Administered 2019-01-23: 2 g via INTRAVENOUS

## 2019-01-23 MED ORDER — PROPOFOL 10 MG/ML IV BOLUS
INTRAVENOUS | Status: DC | PRN
  Administered 2019-01-23: 120 mg via INTRAVENOUS

## 2019-01-23 MED ORDER — DEXAMETHASONE SODIUM PHOSPHATE 10 MG/ML IJ SOLN
INTRAMUSCULAR | Status: AC
  Filled 2019-01-23: qty 1

## 2019-01-23 MED ORDER — HYDROMORPHONE 1 MG/ML IV SOLN
INTRAVENOUS | Status: DC
  Administered 2019-01-23: 30 mg via INTRAVENOUS
  Filled 2019-01-23: qty 30

## 2019-01-23 MED ORDER — NALOXONE HCL 0.4 MG/ML IJ SOLN: 0.4000 mg | INTRAMUSCULAR | Status: DC | PRN

## 2019-01-23 MED ORDER — ONDANSETRON HCL 4 MG/2ML IJ SOLN: 4.0000 mg | Freq: Four times a day (QID) | INTRAMUSCULAR | Status: DC | PRN
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
## 2019-01-23 NOTE — Anesthesia Preprocedure Evaluation (Signed)
Anesthesia Evaluation  Patient identified by MRN, date of birth, ID band Patient confused    Reviewed: Patient's Chart, lab work & pertinent test results, Unable to perform ROS - Chart review onlyPreop documentation limited or incomplete due to emergent nature of procedure.  Airway Mallampati: II   Neck ROM: Full    Dental  (+) Teeth Intact   Pulmonary    breath sounds clear to auscultation       Cardiovascular  Rhythm:Regular Rate:Tachycardia     Neuro/Psych    GI/Hepatic   Endo/Other    Renal/GU      Musculoskeletal   Abdominal   Peds  Hematology   Anesthesia Other Findings   Reproductive/Obstetrics                             Anesthesia Physical Anesthesia Plan  ASA: IV and emergent  Anesthesia Plan: General   Post-op Pain Management:    Induction: Intravenous, Cricoid pressure planned and Rapid sequence  PONV Risk Score and Plan: Ondansetron and Dexamethasone  Airway Management Planned:   Additional Equipment:   Intra-op Plan:   Post-operative Plan: Possible Post-op intubation/ventilation  Informed Consent:   Plan Discussed with: CRNA and Anesthesiologist  Anesthesia Plan Comments:         Anesthesia Quick Evaluation
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
## 2019-01-23 NOTE — Anesthesia Procedure Notes (Signed)
Procedure Name: Intubation Date/Time: 01/23/2019 12:04 AM Performed by: Valetta Fuller, CRNA Pre-anesthesia Checklist: Patient identified, Emergency Drugs available, Suction available and Patient being monitored Patient Re-evaluated:Patient Re-evaluated prior to induction Oxygen Delivery Method: Circle system utilized Preoxygenation: Pre-oxygenation with 100% oxygen Induction Type: IV induction, Rapid sequence and Cricoid Pressure applied Laryngoscope Size: Miller and 2 Grade View: Grade I Tube size: 7.5 mm Number of attempts: 1 Airway Equipment and Method: Stylet Placement Confirmation: ETT inserted through vocal cords under direct vision,  positive ETCO2 and breath sounds checked- equal and bilateral Secured at: 23 cm Tube secured with: Tape Dental Injury: Teeth and Oropharynx as per pre-operative assessment
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
## 2019-01-24 LAB — BASIC METABOLIC PANEL
Anion gap: 5 (ref 5–15)
BUN: 8 mg/dL (ref 6–20)
CO2: 24 mmol/L (ref 22–32)
Calcium: 7.7 mg/dL — ABNORMAL LOW (ref 8.9–10.3)
Chloride: 107 mmol/L (ref 98–111)
Creatinine, Ser: 0.9 mg/dL (ref 0.61–1.24)
GFR calc Af Amer: >60 mL/min (ref >60)
GFR calc non Af Amer: >60 mL/min (ref >60)
Glucose, Bld: 111 mg/dL — ABNORMAL HIGH (ref 70–99)
Potassium: 3.6 mmol/L (ref 3.5–5.1)
Sodium: 136 mmol/L (ref 135–145)

## 2019-01-24 LAB — CBC
HCT: 27.6 % — ABNORMAL LOW (ref 39.0–52.0)
Hemoglobin: 9.5 g/dL — ABNORMAL LOW (ref 13.0–17.0)
MCH: 30.2 pg (ref 26.0–34.0)
MCHC: 34.4 g/dL (ref 30.0–36.0)
MCV: 87.6 fL (ref 80.0–100.0)
Platelets: 142 10*3/uL — ABNORMAL LOW (ref 150–400)
RBC: 3.15 MIL/uL — ABNORMAL LOW (ref 4.22–5.81)
RDW: 12.4 % (ref 11.5–15.5)
WBC: 9.5 10*3/uL (ref 4.0–10.5)
nRBC: 0 % (ref 0.0–0.2)

## 2019-01-24 LAB — SURGICAL PATHOLOGY

## 2019-01-24 MED ORDER — PNEUMOCOCCAL VAC POLYVALENT 25 MCG/0.5ML IJ INJ
0.5000 mL | INJECTION | INTRAMUSCULAR | Status: AC
  Administered 2019-01-25: 0.5 mL via INTRAMUSCULAR
  Filled 2019-01-24: qty 0.5

## 2019-01-24 MED ORDER — OXYCODONE HCL 5 MG PO TABS
5.0000 mg | ORAL_TABLET | ORAL | Status: DC | PRN
  Administered 2019-01-24 – 2019-01-26 (×10): 10 mg via ORAL
  Filled 2019-01-24 (×10): qty 2

## 2019-01-24 MED ORDER — INFLUENZA VAC SPLIT QUAD 0.5 ML IM SUSY
0.5000 mL | PREFILLED_SYRINGE | INTRAMUSCULAR | Status: AC
  Administered 2019-01-25: 0.5 mL via INTRAMUSCULAR
  Filled 2019-01-24: qty 0.5

## 2019-01-24 MED ORDER — METHOCARBAMOL 750 MG PO TABS
750.0000 mg | ORAL_TABLET | Freq: Three times a day (TID) | ORAL | Status: DC
  Administered 2019-01-24 – 2019-01-26 (×8): 750 mg via ORAL
  Filled 2019-01-24 (×7): qty 1

## 2019-01-24 MED ORDER — GABAPENTIN 600 MG PO TABS
300.0000 mg | ORAL_TABLET | Freq: Three times a day (TID) | ORAL | Status: DC
  Administered 2019-01-24 – 2019-01-25 (×4): 300 mg via ORAL
  Filled 2019-01-24 (×4): qty 1

## 2019-01-24 MED ORDER — POLYETHYLENE GLYCOL 3350 17 G PO PACK
17.0000 g | PACK | Freq: Every day | ORAL | Status: DC
  Administered 2019-01-24 – 2019-01-26 (×3): 17 g via ORAL
  Filled 2019-01-24 (×3): qty 1

## 2019-01-24 MED ORDER — HYDROMORPHONE HCL 1 MG/ML IJ SOLN
1.0000 mg | INTRAMUSCULAR | Status: DC | PRN
  Administered 2019-01-24 – 2019-01-25 (×7): 2 mg via INTRAVENOUS
  Filled 2019-01-24 (×7): qty 2
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
## 2019-01-25 LAB — BASIC METABOLIC PANEL
Anion gap: 9 (ref 5–15)
BUN: 7 mg/dL (ref 6–20)
CO2: 23 mmol/L (ref 22–32)
Calcium: 7.9 mg/dL — ABNORMAL LOW (ref 8.9–10.3)
Chloride: 103 mmol/L (ref 98–111)
Creatinine, Ser: 0.99 mg/dL (ref 0.61–1.24)
GFR calc Af Amer: >60 mL/min (ref >60)
GFR calc non Af Amer: >60 mL/min (ref >60)
Glucose, Bld: 106 mg/dL — ABNORMAL HIGH (ref 70–99)
Potassium: 3.5 mmol/L (ref 3.5–5.1)
Sodium: 135 mmol/L (ref 135–145)

## 2019-01-25 LAB — CBC
HCT: 24.9 % — ABNORMAL LOW (ref 39.0–52.0)
Hemoglobin: 8.5 g/dL — ABNORMAL LOW (ref 13.0–17.0)
MCH: 29.9 pg (ref 26.0–34.0)
MCHC: 34.1 g/dL (ref 30.0–36.0)
MCV: 87.7 fL (ref 80.0–100.0)
Platelets: 133 10*3/uL — ABNORMAL LOW (ref 150–400)
RBC: 2.84 MIL/uL — ABNORMAL LOW (ref 4.22–5.81)
RDW: 12.2 % (ref 11.5–15.5)
WBC: 8.6 10*3/uL (ref 4.0–10.5)
nRBC: 0 % (ref 0.0–0.2)

## 2019-01-25 MED ORDER — POTASSIUM CHLORIDE 20 MEQ PO PACK
40.0000 meq | PACK | Freq: Once | ORAL | Status: AC
  Administered 2019-01-25: 40 meq via ORAL
  Filled 2019-01-25: qty 2

## 2019-01-25 MED ORDER — PROCHLORPERAZINE EDISYLATE 10 MG/2ML IJ SOLN: 10.0000 mg | Freq: Four times a day (QID) | INTRAMUSCULAR | Status: DC | PRN

## 2019-01-25 MED ORDER — LORAZEPAM 2 MG/ML IJ SOLN
1.0000 mg | Freq: Four times a day (QID) | INTRAMUSCULAR | Status: DC | PRN
  Administered 2019-01-25 (×2): 1 mg via INTRAVENOUS
  Filled 2019-01-25 (×2): qty 1

## 2019-01-25 MED ORDER — GABAPENTIN 100 MG PO CAPS
100.0000 mg | ORAL_CAPSULE | Freq: Three times a day (TID) | ORAL | Status: DC
  Administered 2019-01-25 – 2019-01-26 (×4): 100 mg via ORAL
  Filled 2019-01-25 (×4): qty 1

## 2019-01-25 NOTE — Progress Notes (Signed)
CSW met with patient to engage in SBIRT per trauma protocol. Patient scored a total of 7 on their SBIRT exam. CSW provided psychoeducation on substance use and offered patient with resources as appropriate. CSW provided brief intervention to patient discussing motivation to continue abstaining from illicit use. Patient declined outpatient resources. Patient responded openly/appropriate to discussions of substance use.   Lamonte Richer, LCSW, [place] [facility] [phone]
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
## 2019-01-26 LAB — BASIC METABOLIC PANEL
Anion gap: 9 (ref 5–15)
BUN: 8 mg/dL (ref 6–20)
CO2: 22 mmol/L (ref 22–32)
Calcium: 8.4 mg/dL — ABNORMAL LOW (ref 8.9–10.3)
Chloride: 102 mmol/L (ref 98–111)
Creatinine, Ser: 0.92 mg/dL (ref 0.61–1.24)
GFR calc Af Amer: >60 mL/min (ref >60)
GFR calc non Af Amer: >60 mL/min (ref >60)
Glucose, Bld: 94 mg/dL (ref 70–99)
Potassium: 3.6 mmol/L (ref 3.5–5.1)
Sodium: 133 mmol/L — ABNORMAL LOW (ref 135–145)

## 2019-01-26 LAB — CBC
HCT: 24.6 % — ABNORMAL LOW (ref 39.0–52.0)
Hemoglobin: 8.9 g/dL — ABNORMAL LOW (ref 13.0–17.0)
MCH: 30.8 pg (ref 26.0–34.0)
MCHC: 36.2 g/dL — ABNORMAL HIGH (ref 30.0–36.0)
MCV: 85.1 fL (ref 80.0–100.0)
Platelets: 143 10*3/uL — ABNORMAL LOW (ref 150–400)
RBC: 2.89 MIL/uL — ABNORMAL LOW (ref 4.22–5.81)
RDW: 11.8 % (ref 11.5–15.5)
WBC: 8.8 10*3/uL (ref 4.0–10.5)
nRBC: 0 % (ref 0.0–0.2)

## 2019-01-26 MED ORDER — GABAPENTIN 100 MG PO CAPS: 100.0000 mg | ORAL_CAPSULE | Freq: Three times a day (TID) | ORAL | 0 refills | Status: AC

## 2019-01-26 MED ORDER — ACETAMINOPHEN 325 MG PO TABS: 650.0000 mg | ORAL_TABLET | Freq: Four times a day (QID) | ORAL | Status: AC

## 2019-01-26 MED ORDER — OXYCODONE HCL 10 MG PO TABS: 5.0000 mg | ORAL_TABLET | Freq: Four times a day (QID) | ORAL | 0 refills | Status: AC | PRN

## 2019-01-26 MED ORDER — METHOCARBAMOL 750 MG PO TABS: 750.0000 mg | ORAL_TABLET | Freq: Three times a day (TID) | ORAL | 0 refills | Status: AC
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
## 2019-01-26 NOTE — Discharge Instructions (Signed)
You may go to [place], [employer], or any drug store to get normal saline, gauze, and tape for your wound care. Moisten gauze with normal saline and wring it out as much as your can.  Place this in your wound and cover with dry gauze and tape.  Remove prior to getting in the shower and then replace when you get out of the shower.  [facility], [place] [phone]  OPEN ABDOMINAL SURGERY: POST OP INSTRUCTIONS  Always review your discharge instruction sheet given to you by the facility where your surgery was performed.  IF YOU HAVE DISABILITY OR FAMILY LEAVE FORMS, YOU MUST BRING THEM TO THE OFFICE FOR PROCESSING.  PLEASE DO NOT GIVE THEM TO YOUR DOCTOR.  1. A prescription for pain medication may be given to you upon discharge.  Take your pain medication as prescribed, if needed.  If narcotic pain medicine is not needed, then you may take acetaminophen (Tylenol) or ibuprofen (Advil) as needed. 2. Take your usually prescribed medications unless otherwise directed. 3. If you need a refill on your pain medication, please contact your pharmacy. They will contact our office to request authorization.  Prescriptions will not be filled after 5pm or on week-ends. 4. You should follow a light diet the first few days after arrival home, such as soup and crackers, pudding, etc.unless your doctor has advised otherwise. A high-fiber, low fat diet can be resumed as tolerated.   Be sure to include lots of fluids daily. Most patients will experience some swelling and bruising on the chest and neck area.  Ice packs will help.  Swelling and bruising can take several days to resolve 5. Most patients will experience some swelling and bruising in the area of the incision. Ice pack will help. Swelling and bruising can take several days to resolve..  6. It is common to experience some constipation if taking pain medication after surgery.  Increasing fluid intake and taking a stool softener will usually help or  prevent this problem from occurring.  A mild laxative (Milk of Magnesia or Miralax) should be taken according to package directions if there are no bowel movements after 48 hours. 7.  You may have steri-strips (small skin tapes) in place directly over the incision.  These strips should be left on the skin for 7-10 days.  If your surgeon used skin glue on the incision, you may shower in 24 hours.  The glue will flake off over the next 2-3 weeks.  Any sutures or staples will be removed at the office during your follow-up visit. You may find that a light gauze bandage over your incision may keep your staples from being rubbed or pulled. You may shower and replace the bandage daily. 8. ACTIVITIES:  You may resume regular (light) daily activities beginning the next day--such as daily self-care, walking, climbing stairs--gradually increasing activities as tolerated.  You may have sexual intercourse when it is comfortable.  Refrain from any heavy lifting or straining until approved by your doctor. a. You may drive when you no longer are taking prescription pain medication, you can comfortably wear a seatbelt, and you can safely maneuver your car and apply brakes  9. You should see your doctor in the office for a follow-up appointment approximately two weeks after your surgery.  Make sure that you call for this appointment within a day or two after you arrive home to insure a convenient appointment time.   WHEN TO CALL YOUR DOCTOR:  1. Fever over 101.0 2. Inability to urinate 3. Nausea and/or vomiting 4. Extreme swelling or bruising 5. Continued bleeding from incision. 6. Increased pain, redness, or drainage from the incision. 7. Difficulty swallowing or breathing 8. Muscle cramping or spasms. 9. Numbness or tingling in hands or feet or around lips.  The clinic staff is available to answer your questions during regular business hours.  Please dont hesitate to call and ask to speak to one of the nurses if  you have concerns.  For further questions, please visit [email or web address]

## 2019-01-27 ENCOUNTER — Encounter (HOSPITAL_COMMUNITY): Payer: Self-pay | Admitting: Emergency Medicine
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
## 2020-11-21 ENCOUNTER — Other Ambulatory Visit: Payer: Self-pay

## 2020-11-21 ENCOUNTER — Encounter (HOSPITAL_COMMUNITY): Payer: Self-pay | Admitting: *Deleted

## 2020-11-21 ENCOUNTER — Ambulatory Visit (HOSPITAL_COMMUNITY)
Admission: EM | Admit: 2020-11-21 | Discharge: 2020-11-21 | Disposition: A | Discharge status: Home / Self Care | Payer: Self-pay | Attending: Internal Medicine | Admitting: Internal Medicine

## 2020-11-21 DIAGNOSIS — J029 Acute pharyngitis, unspecified: Secondary | ICD-10-CM | POA: Insufficient documentation

## 2020-11-21 DIAGNOSIS — U071 COVID-19: Secondary | ICD-10-CM | POA: Insufficient documentation

## 2020-11-21 LAB — POCT RAPID STREP A, ED / UC: Streptococcus, Group A Screen (Direct): NEGATIVE

## 2020-11-21 LAB — SARS CORONAVIRUS 2 (TAT 6-24 HRS): SARS Coronavirus 2: POSITIVE — AB
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
## 2020-11-21 NOTE — ED Notes (Signed)
Strep and covid swabs in lab

## 2020-11-21 NOTE — Discharge Instructions (Addendum)
You likely having a viral upper respiratory infection related to Covid 19 due to positive at home test. We recommended symptom control. I expect your symptoms to start improving in the next 1-2 weeks.   1. Take a daily allergy pill/anti-histamine like Zyrtec, Claritin, or Store brand consistently for 2 weeks  2. For congestion you may try an oral decongestant like Mucinex or sudafed. You may also try intranasal flonase nasal spray or saline irrigations (neti pot, sinus cleanse)  3. For your sore throat you may try cepacol lozenges, salt water gargles, throat spray. Treatment of congestion may also help your sore throat.  4. For cough you may try Robitussen, Mucinex DM  5. Take Tylenol or Ibuprofen to help with pain/inflammation  6. Stay hydrated, drink plenty of fluids to keep throat coated and less irritated  Honey Tea For cough/sore throat try using a honey-based tea. Use 3 teaspoons of honey with juice squeezed from half lemon. Place shaved pieces of ginger into 1/2-1 cup of water and warm over stove top. Then mix the ingredients and repeat every 4 hours as needed.   Your rapid strep test was negative in the urgent care. Throat culture and covid 19 tests are pending. We will call if these are positive.   Quarantine is 5 days from symptom start. You must also wear a mask 5 additional days.

## 2020-11-21 NOTE — ED Triage Notes (Addendum)
Pt tested positive for COVID last WED with a home test. Today pt presents  with swelling of his tonsils  Yesterday. PT wants a COVID test today.

## 2020-11-23 LAB — CULTURE, GROUP A STREP (THRC)

## 2023-08-26 ENCOUNTER — Other Ambulatory Visit: Payer: Self-pay | Admitting: Certified Nurse Midwife

## 2023-08-26 DIAGNOSIS — A493 Mycoplasma infection, unspecified site: Secondary | ICD-10-CM

## 2023-08-26 MED ORDER — AZITHROMYCIN 250 MG PO TABS: ORAL_TABLET | ORAL | 0 refills | Status: AC

## 2023-08-26 MED ORDER — DOXYCYCLINE HYCLATE 100 MG PO TABS: 100.0000 mg | ORAL_TABLET | Freq: Two times a day (BID) | ORAL | 0 refills | Status: AC

## 2023-08-26 NOTE — Progress Notes (Signed)
 Sent for expedited partner treatment.  Victor Weiss, CNM, MSN, IBCLC Certified Nurse Midwife, [facility]

## 2023-12-12 ENCOUNTER — Other Ambulatory Visit: Payer: Self-pay | Admitting: Certified Nurse Midwife

## 2023-12-12 DIAGNOSIS — A498 Other bacterial infections of unspecified site: Secondary | ICD-10-CM

## 2023-12-12 MED ORDER — METRONIDAZOLE 500 MG PO TABS: 500.0000 mg | ORAL_TABLET | Freq: Two times a day (BID) | ORAL | 0 refills | Status: AC
